# Patient Record
Sex: Male | Born: 1985 | Race: White | Hispanic: No | Marital: Single | Smoking: Never smoker
Health system: Southern US, Community
[De-identification: ages and names within clinical notes are randomized; demographics above are authoritative.]

---

## 2013-01-10 ENCOUNTER — Emergency Department (HOSPITAL_COMMUNITY)
Admission: EM | Admit: 2013-01-10 | Discharge: 2013-01-11 | Disposition: A | Discharge status: Home / Self Care | Payer: BC Managed Care – PPO | Attending: Emergency Medicine | Admitting: Emergency Medicine

## 2013-01-10 ENCOUNTER — Encounter (HOSPITAL_COMMUNITY): Payer: Self-pay | Admitting: *Deleted

## 2013-01-10 DIAGNOSIS — T07XXXA Unspecified multiple injuries, initial encounter: Secondary | ICD-10-CM

## 2013-01-10 DIAGNOSIS — Y92410 Unspecified street and highway as the place of occurrence of the external cause: Secondary | ICD-10-CM | POA: Insufficient documentation

## 2013-01-10 DIAGNOSIS — S0990XA Unspecified injury of head, initial encounter: Secondary | ICD-10-CM | POA: Insufficient documentation

## 2013-01-10 DIAGNOSIS — Y9389 Activity, other specified: Secondary | ICD-10-CM | POA: Insufficient documentation

## 2013-01-10 DIAGNOSIS — IMO0002 Reserved for concepts with insufficient information to code with codable children: Secondary | ICD-10-CM | POA: Insufficient documentation

## 2013-01-10 DIAGNOSIS — F1010 Alcohol abuse, uncomplicated: Secondary | ICD-10-CM | POA: Insufficient documentation
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
## 2013-01-10 NOTE — ED Notes (Signed)
Patient removed collar by himself.  Directed him not to to off the collar but he would not listen

## 2013-01-10 NOTE — ED Notes (Signed)
[employer] here to see patient.
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
## 2013-01-11 ENCOUNTER — Emergency Department (HOSPITAL_COMMUNITY): Payer: BC Managed Care – PPO

## 2013-01-11 ENCOUNTER — Encounter (HOSPITAL_COMMUNITY): Payer: Self-pay | Admitting: Radiology

## 2013-01-11 LAB — CBC WITH DIFFERENTIAL/PLATELET
Lymphocytes Relative: 23 % (ref 12–46)
Lymphs Abs: 2.7 10*3/uL (ref 0.7–4.0)
MCV: 86.2 fL (ref 78.0–100.0)
Neutrophils Relative %: 72 % (ref 43–77)
Platelets: 120 10*3/uL — ABNORMAL LOW (ref 150–400)
RBC: 5 MIL/uL (ref 4.22–5.81)
WBC: 11.9 10*3/uL — ABNORMAL HIGH (ref 4.0–10.5)

## 2013-01-11 LAB — COMPREHENSIVE METABOLIC PANEL
AST: 24 U/L (ref 0–37)
CO2: 27 mEq/L (ref 19–32)
Calcium: 9.1 mg/dL (ref 8.4–10.5)
Creatinine, Ser: 0.94 mg/dL (ref 0.50–1.35)
GFR calc Af Amer: >90 mL/min (ref >90)
GFR calc non Af Amer: >90 mL/min (ref >90)

## 2013-01-11 LAB — ETHANOL: Alcohol, Ethyl (B): 231 mg/dL — ABNORMAL HIGH (ref 0–11)

## 2013-01-11 MED ORDER — HYDROCODONE-ACETAMINOPHEN 5-325 MG PO TABS
1.0000 | ORAL_TABLET | Freq: Once | ORAL | Status: AC
  Administered 2013-01-11: 1 via ORAL
  Filled 2013-01-11: qty 1

## 2013-01-11 MED ORDER — HYDROCODONE-ACETAMINOPHEN 5-325 MG PO TABS: 1.0000 | ORAL_TABLET | Freq: Four times a day (QID) | ORAL | Status: DC | PRN
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
## 2021-11-13 ENCOUNTER — Encounter (HOSPITAL_COMMUNITY): Payer: Self-pay

## 2021-11-13 ENCOUNTER — Emergency Department (HOSPITAL_COMMUNITY)
Admission: EM | Admit: 2021-11-13 | Discharge: 2021-11-13 | Disposition: A | Discharge status: Home / Self Care | Payer: 59 | Attending: Emergency Medicine | Admitting: Emergency Medicine

## 2021-11-13 ENCOUNTER — Emergency Department (HOSPITAL_COMMUNITY): Payer: 59

## 2021-11-13 ENCOUNTER — Other Ambulatory Visit: Payer: Self-pay

## 2021-11-13 DIAGNOSIS — S62512A Displaced fracture of proximal phalanx of left thumb, initial encounter for closed fracture: Secondary | ICD-10-CM | POA: Diagnosis not present

## 2021-11-13 DIAGNOSIS — Y92009 Unspecified place in unspecified non-institutional (private) residence as the place of occurrence of the external cause: Secondary | ICD-10-CM | POA: Diagnosis not present

## 2021-11-13 DIAGNOSIS — S65402A Unspecified injury of blood vessel of left thumb, initial encounter: Secondary | ICD-10-CM | POA: Diagnosis not present

## 2021-11-13 DIAGNOSIS — S62512B Displaced fracture of proximal phalanx of left thumb, initial encounter for open fracture: Secondary | ICD-10-CM | POA: Insufficient documentation

## 2021-11-13 DIAGNOSIS — Z23 Encounter for immunization: Secondary | ICD-10-CM | POA: Diagnosis not present

## 2021-11-13 DIAGNOSIS — W232XXA Caught, crushed, jammed or pinched between a moving and stationary object, initial encounter: Secondary | ICD-10-CM | POA: Diagnosis not present

## 2021-11-13 DIAGNOSIS — S61012A Laceration without foreign body of left thumb without damage to nail, initial encounter: Secondary | ICD-10-CM | POA: Diagnosis not present

## 2021-11-13 MED ORDER — CEPHALEXIN 500 MG PO CAPS: 500.0000 mg | ORAL_CAPSULE | Freq: Four times a day (QID) | ORAL | 0 refills | Status: AC

## 2021-11-13 MED ORDER — IBUPROFEN 600 MG PO TABS: 600.0000 mg | ORAL_TABLET | Freq: Three times a day (TID) | ORAL | 0 refills | Status: AC

## 2021-11-13 MED ORDER — POVIDONE-IODINE 10 % EX SOLN
CUTANEOUS | Status: AC
  Filled 2021-11-13: qty 29.6

## 2021-11-13 MED ORDER — CEFAZOLIN SODIUM-DEXTROSE 1-4 GM/50ML-% IV SOLN
1.0000 g | Freq: Once | INTRAVENOUS | Status: AC
  Administered 2021-11-13: 1 g via INTRAVENOUS
  Filled 2021-11-13: qty 50

## 2021-11-13 MED ORDER — CEPHALEXIN 500 MG PO CAPS: 500.0000 mg | ORAL_CAPSULE | Freq: Four times a day (QID) | ORAL | 0 refills | Status: DC

## 2021-11-13 MED ORDER — HYDROCODONE-ACETAMINOPHEN 5-325 MG PO TABS: 1.0000 | ORAL_TABLET | ORAL | 0 refills | Status: DC | PRN

## 2021-11-13 MED ORDER — IBUPROFEN 600 MG PO TABS: 600.0000 mg | ORAL_TABLET | Freq: Three times a day (TID) | ORAL | 0 refills | Status: DC

## 2021-11-13 MED ORDER — TETANUS-DIPHTH-ACELL PERTUSSIS 5-2.5-18.5 LF-MCG/0.5 IM SUSY
0.5000 mL | PREFILLED_SYRINGE | Freq: Once | INTRAMUSCULAR | Status: AC
  Administered 2021-11-13: 0.5 mL via INTRAMUSCULAR
  Filled 2021-11-13: qty 0.5

## 2021-11-13 MED ORDER — BUPIVACAINE HCL (PF) 0.5 % IJ SOLN
10.0000 mL | Freq: Once | INTRAMUSCULAR | Status: AC
  Administered 2021-11-13: 10 mL
  Filled 2021-11-13: qty 30

## 2021-11-13 NOTE — Discharge Instructions (Signed)
Take the entire course of the antibiotics prescribed.  You may also use ice and elevation to help with pain and swelling along with the ibuprofen prescribed.  Use the hydrocodone if needed for additional pain relief.  This medication will make you drowsy, do not drive within 4 hours of taking this.  I do recommend changing the dressing on Sunday, look for any signs of infection including redness, increased swelling, drainage of pus.  If it looks okay, apply a new dressing.  Get rechecked if there is any concern for infection.

## 2021-11-13 NOTE — ED Triage Notes (Signed)
Patient arrived with complaints of laceration to left thumb. Patient stated that he cut finger on a ground rod. Laceration is open with bone exposed.
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
## 2021-11-17 ENCOUNTER — Encounter: Payer: Self-pay | Admitting: Orthopedic Surgery

## 2021-11-17 ENCOUNTER — Ambulatory Visit (INDEPENDENT_AMBULATORY_CARE_PROVIDER_SITE_OTHER): Payer: 59

## 2021-11-17 ENCOUNTER — Ambulatory Visit: Payer: 59 | Admitting: Orthopedic Surgery

## 2021-11-17 VITALS — BP 136/83 | HR 66 | Ht 67.0 in | Wt 155.0 lb

## 2021-11-17 DIAGNOSIS — S6992XA Unspecified injury of left wrist, hand and finger(s), initial encounter: Secondary | ICD-10-CM | POA: Diagnosis not present

## 2021-11-17 DIAGNOSIS — S62512B Displaced fracture of proximal phalanx of left thumb, initial encounter for open fracture: Secondary | ICD-10-CM

## 2021-11-17 NOTE — Patient Instructions (Signed)
General Cast Instructions  1.  You were placed in a splint in clinic today.  Please keep the cast material clean, dry and intact.  Please do not use anything to itch the under the splint.  If it gets itchy, you can consider taking benadryl, or similar medication.  If the cast material gets wet, place it on a towel and use a hair dryer on a low setting. 2.  Tylenol or Ibuprofen/Naproxen as needed.   3.  Recommend elevating your extremity as much as possible to help with swelling. 4.  F/u 2 weeks, splint off and repeat XR   Medications  We have discussed taking over the counter medications for your pain.  Below are some common medicines to consider.  Also listed are the recommended doses and how to take these medications as a prescription strength medicine.  If you experience any issues or side effects, please discontinue the medicine and contact the office for some assistance.   Tylenol or acetaminophen - can be used to help treat minor pains and fevers or chills.  Common doses include 350 mg, 500 mg and 650 mg.  Following surgery, I recommend taking 1000 mg of this medication three times a day.  Some narcotic pain medications contain acetaminophen, so you cannot take additional acetaminophen. This medicine can be bad for your liver.  DO NOT TAKE MORE THAN 4000 mg per day.  Advil, Motrin or Ibuprofen - anti-inflammatory medicines.  Can be used to treat chronic pain and help with swelling.  Common over the counter dose includes 200 mg.  Bottle states you can take 1-2 pills.  Common prescription doses include 600 mg or 800 mg pills.  This medication can be hard on your stomach and your kidneys.  If you have a history of stomach ulcers or kidney issues, you should discuss this with your PCP.  This medicine can be taken 3 times per day.  You should take this medication with food in your stomach.   Aleve or Naproxen - anti inflammatory medicines.  Can be used to treat chronic pain and help with swelling.  Common over the counter dose is 220 mg.  The bottle states you can take 1 pill, twice a day.   Typical prescription dose is 500 mg two times a day.  This medication can be hard on your stomach and your kidneys.  If you have a history of stomach ulcers or kidney issues, you should discuss this with your PCP.  This medicine can be taken 2 times per day.  You should take this medication with food in your stomach.    Acetaminopen and ibuprofen/naproxen can be taking at the same time, but please do not take ibuprofen and naproxen at the same time.  These medications work the same way and can cause further injury to your stomach and/or kidneys.    Patients taking a blood thinner are often encouraged not to take NSAIDs or medications like ibuprofen or naproxen because they increase the possibility of internal bleeding.  If you have any questions, you should contact the doctor who recommended these medicines.

## 2021-11-17 NOTE — Progress Notes (Signed)
New Patient Visit  Assessment: Cory Maldonado is a 36 y.o. male with the following: 1. Open displaced fracture of proximal phalanx of left thumb, initial encounter  Plan: Darrnell Mangiaracina Tyndall sustained a left thumb fracture, with an associated laceration, a few days ago.  His pain is controlled.  Repeat radiographs today demonstrates comminution at the IP joint of the left thumb, but overall, the IP joint remains in good alignment.  We will plan to treat this without surgery.  I have advised him to continue taking the antibiotics until completion.  He was placed in a thumb spica splint in clinic today.  I will see him back in approximately 2 weeks for repeat evaluation.  Continue to take Tylenol and ibuprofen as needed.  Cast application -left short arm splint-thumb spica   Verbal consent was obtained and the correct extremity was identified. A well padded, appropriately molded thumb spica splint was applied to the left arm Fingers remained warm and well perfused.   There were no sharp edges Patient tolerated the procedure well Cast care instructions were provided     Follow-up: Return in about 2 weeks (around 12/01/2021).  Subjective:  Chief Complaint  Patient presents with   Hand Injury    Thumb left/ crush injury 11/13/20 has been changing dressings daily     History of Present Illness: Cory Maldonado is a 36 y.o. male who presents for evaluation of left thumb pain.  A few days ago, he sustained an injury to his left thumb.  He was using an impactor, to put in fence post.  He missed the fence post, and subsequently crushed his left thumb.  He was evaluated in the emergency department.  He had an associated laceration with a fracture of the proximal phalanx of the left thumb.  He has had an AlumaFoam splint on his thumb.  He is taking ibuprofen for pain only.  No fevers or chills.  He has been taking antibiotics as prescribed by the ED.   Review of Systems: No fevers or chills No  numbness or tingling No chest pain No shortness of breath No bowel or bladder dysfunction No GI distress No headaches   Medical History:  History reviewed. No pertinent past medical history.  History reviewed. No pertinent surgical history.  History reviewed. No pertinent family history. Social History   Tobacco Use   Smoking status: Never   Smokeless tobacco: Current    Types: Snuff  Vaping Use   Vaping Use: Never used  Substance Use Topics   Alcohol use: Not Currently   Drug use: Not Currently    No Known Allergies  Current Meds  Medication Sig   cephALEXin (KEFLEX) 500 MG capsule Take 1 capsule (500 mg total) by mouth 4 (four) times daily.   ibuprofen (ADVIL) 600 MG tablet Take 1 tablet (600 mg total) by mouth 3 (three) times daily.   [DISCONTINUED] HYDROcodone-acetaminophen (NORCO/VICODIN) 5-325 MG tablet Take 1 tablet by mouth every 4 (four) hours as needed.    Objective: BP 136/83   Pulse 66   Ht 5\' 7"  (1.702 m)   Wt 155 lb (70.3 kg)   BMI 24.28 kg/m   Physical Exam:  General: Alert and oriented. and No acute distress. Gait: Normal gait.  Left thumb with a laceration on the dorsal aspect.  This is healing well.  No purulence.  No surrounding erythema.  Dissolvable sutures remain in place.  Limited swelling of the thumb.  He has sensation intact on  the radial and ulnar aspect of the thumb.  Good capillary refill to the tip of his thumb.  IMAGING: I personally ordered and reviewed the following images  X-rays of the left thumb were obtained in clinic today.  There is a comminuted fracture of the distal extent of the proximal phalanx.  This involves the joint line.  Overall, there is minimal displacement of the fragments.  No change in overall alignment compared to the injury films.  Impression: Comminuted fracture of the distal aspect of the left thumb proximal phalanx, acceptable alignment overall.  New Medications:  No orders of the defined types were  placed in this encounter.     Oliver Barre, MD  11/17/2021 10:15 PM

## 2021-12-02 ENCOUNTER — Ambulatory Visit (INDEPENDENT_AMBULATORY_CARE_PROVIDER_SITE_OTHER): Payer: 59 | Admitting: Orthopedic Surgery

## 2021-12-02 ENCOUNTER — Encounter: Payer: Self-pay | Admitting: Orthopedic Surgery

## 2021-12-02 ENCOUNTER — Ambulatory Visit (INDEPENDENT_AMBULATORY_CARE_PROVIDER_SITE_OTHER): Payer: 59

## 2021-12-02 VITALS — Ht 67.0 in | Wt 155.0 lb

## 2021-12-02 DIAGNOSIS — S62512D Displaced fracture of proximal phalanx of left thumb, subsequent encounter for fracture with routine healing: Secondary | ICD-10-CM | POA: Diagnosis not present

## 2021-12-02 NOTE — Patient Instructions (Signed)
General Cast Instructions  1.  You were placed in a cast in clinic today.  Please keep the cast material clean, dry and intact.  Please do not use anything to itch the under the cast.  If it gets itchy, you can consider taking benadryl, or similar medication.  If the cast material gets wet, place it on a towel and use a hair dryer on a low setting. 2.  Tylenol or Ibuprofen/Naproxen as needed.   3.  Recommend elevating your extremity as much as possible to help with swelling. 4.  F/u 3 weeks, cast off and repeat XR  

## 2021-12-02 NOTE — Progress Notes (Signed)
Return Patient Visit  Assessment: Cory Maldonado is a 36 y.o. male with the following: 1. Open displaced fracture of proximal phalanx of left thumb  Plan: Alvie Fowles Forker sustained a left thumb IP joint, intra-articular fracture, with comminution.  He has no pain.  Radiographs are stable.  The associated laceration is healing well.  No concerns at this time.  We will keep him immobilized for another 3 weeks.  Cast application -left short arm splint-thumb spica   Verbal consent was obtained and the correct extremity was identified. A well padded, appropriately molded thumb spica splint was applied to the left arm Fingers remained warm and well perfused.   There were no sharp edges Patient tolerated the procedure well Cast care instructions were provided     Follow-up: Return in about 3 weeks (around 12/23/2021).  Subjective:  Chief Complaint  Patient presents with   Fracture    LT thumb DOI 11/13/21    History of Present Illness: Cory Maldonado is a 36 y.o. male who returns for evaluation of left thumb pain.  Approximately 2-3 weeks ago, he sustained an injury to his left thumb.  He was placed in a splint.  He also had a laceration.  He completed antibiotics.  No issues with healing.  No fevers or chills.  He states he has been removing the fiberglass splint to rest the thumb.  He has not been using his left hand.  He has done some work, but is only using his right hand.  Review of Systems: No fevers or chills No numbness or tingling No chest pain No shortness of breath No bowel or bladder dysfunction No GI distress No headaches   Objective: Ht 5\' 7"  (1.702 m)   Wt 155 lb (70.3 kg)   BMI 24.28 kg/m   Physical Exam:  General: Alert and oriented. and No acute distress. Gait: Normal gait.  Left thumb dorsal laceration is healing well.  There is some scabbing.  No numbness or tingling on the radial or ulnar aspect of the thumb.  Some numbness on the dorsal aspect,  overlying the laceration.  Limited motion at the IP joint.  He does have some swelling.  No bruising.  IMAGING: I personally ordered and reviewed the following images  Multiple views of the left thumb were obtained in clinic today.  These are compared to prior x-rays.  He has a comminuted fracture of the distal aspect of the proximal phalanx, with IP joint extension.  Multiple fragments which are essentially anatomically aligned.  These are unchanged.  No additional injuries are noted.  Impression: Left thumb proximal phalanx, distal intra-articular comminuted fracture in stable alignment.  New Medications:  No orders of the defined types were placed in this encounter.     , MD  12/02/2021 8:42 AM

## 2021-12-04 ENCOUNTER — Encounter: Payer: 59 | Admitting: Orthopedic Surgery

## 2021-12-22 ENCOUNTER — Ambulatory Visit (INDEPENDENT_AMBULATORY_CARE_PROVIDER_SITE_OTHER): Payer: 59 | Admitting: Orthopedic Surgery

## 2021-12-22 ENCOUNTER — Ambulatory Visit (INDEPENDENT_AMBULATORY_CARE_PROVIDER_SITE_OTHER): Payer: 59

## 2021-12-22 ENCOUNTER — Encounter: Payer: Self-pay | Admitting: Orthopedic Surgery

## 2021-12-22 DIAGNOSIS — S62512D Displaced fracture of proximal phalanx of left thumb, subsequent encounter for fracture with routine healing: Secondary | ICD-10-CM | POA: Diagnosis not present

## 2021-12-22 NOTE — Patient Instructions (Signed)
Hand Exercises  Hand exercises can be helpful for almost anyone. These exercises can strengthen the hands, improve flexibility and movement, and increase blood flow to the hands. These results can make work and daily tasks easier. Hand exercises can be especially helpful for people who have joint pain from arthritis or have nerve damage from overuse (carpal tunnel syndrome). These exercises can also help people who have injured a hand.  Exercises Most of these hand exercises are gentle stretching and motion exercises. It is usually safe to do them often throughout the day. Warming up your hands before exercise may help to reduce stiffness. You can do this with gentle massage or by placing your hands in warm water for 10-15 minutes. It is normal to feel some stretching, pulling, tightness, or mild discomfort as you begin new exercises. This will gradually improve. Stop an exercise right away if you feel sudden, severe pain or your pain gets worse. Ask your health care provider which exercises are best for you. Knuckle bend or "claw" fist Stand or sit with your arm, hand, and all five fingers pointed straight up. Make sure to keep your wrist straight during the exercise. Gently bend your fingers down toward your palm until the tips of your fingers are touching the top of your palm. Keep your big knuckle straight and just bend the small knuckles in your fingers. Hold this position for 10 seconds. Straighten (extend) your fingers back to the starting position. Repeat this exercise 5-10 times with each hand. Full finger fist Stand or sit with your arm, hand, and all five fingers pointed straight up. Make sure to keep your wrist straight during the exercise. Gently bend your fingers into your palm until the tips of your fingers are touching the middle of your palm. Hold this position for 10 seconds. Extend your fingers back to the starting position, stretching every joint fully. Repeat this exercise  5-10 times with each hand. Straight fist Stand or sit with your arm, hand, and all five fingers pointed straight up. Make sure to keep your wrist straight during the exercise. Gently bend your fingers at the big knuckle, where your fingers meet your hand, and the middle knuckle. Keep the knuckle at the tips of your fingers straight and try to touch the bottom of your palm. Hold this position for 10 seconds. Extend your fingers back to the starting position, stretching every joint fully. Repeat this exercise 5-10 times with each hand. Tabletop Stand or sit with your arm, hand, and all five fingers pointed straight up. Make sure to keep your wrist straight during the exercise. Gently bend your fingers at the big knuckle, where your fingers meet your hand, as far down as you can while keeping the small knuckles in your fingers straight. Think of forming a tabletop with your fingers. Hold this position for 10 seconds. Extend your fingers back to the starting position, stretching every joint fully. Repeat this exercise 5-10 times with each hand. Finger spread Place your hand flat on a table with your palm facing down. Make sure your wrist stays straight as you do this exercise. Spread your fingers and thumb apart from each other as far as you can until you feel a gentle stretch. Hold this position for 10 seconds. Bring your fingers and thumb tight together again. Hold this position for 10 seconds. Repeat this exercise 5-10 times with each hand. Making circles Stand or sit with your arm, hand, and all five fingers pointed straight up. Make   sure to keep your wrist straight during the exercise. Make a circle by touching the tip of your thumb to the tip of your index finger. Hold for 10 seconds. Then open your hand wide. Repeat this motion with your thumb and each finger on your hand. Repeat this exercise 5-10 times with each hand. Thumb motion Sit with your forearm resting on a table and your wrist  straight. Your thumb should be facing up toward the ceiling. Keep your fingers relaxed as you move your thumb. Lift your thumb up as high as you can toward the ceiling. Hold for 10 seconds. Bend your thumb across your palm as far as you can, reaching the tip of your thumb for the small finger (pinkie) side of your palm. Hold for 10 seconds. Repeat this exercise 5-10 times with each hand. Grip strengthening Hold a stress ball or other soft ball in the middle of your hand. Slowly increase the pressure, squeezing the ball as much as you can without causing pain. Think of bringing the tips of your fingers into the middle of your palm. All of your finger joints should bend when doing this exercise. Hold your squeeze for 10 seconds, then relax. Repeat this exercise 5-10 times with each hand.   Contact a health care provider if: Your hand pain or discomfort gets much worse when you do an exercise. Your hand pain or discomfort does not improve within 2 hours after you exercise. If you have any of these problems, stop doing these exercises right away. Do not do them again unless your health care provider says that you can.    Get help right away if: You develop sudden, severe hand pain or swelling. If this happens, stop doing these exercises right away. Do not do them again unless your health care provider says that you can. This information is not intended to replace advice given to you by your health care provider. Make sure you discuss any questions you have with your health care provider.  

## 2021-12-23 NOTE — Progress Notes (Signed)
Return Patient Visit  Assessment: Cory Maldonado is a 36 y.o. male with the following: 1. Open displaced fracture of proximal phalanx of left thumb  Plan: Cory Maldonado sustained a left thumb IP joint, intra-articular fracture, with comminution.  Radiographs are stable.  Minimal pain.  On physical exam, he does have some stiffness at the IP joint, which is to be expected.  Okay to progress his activities.  Advised him to advance slowly.  He states his understanding.  Follow-up in 1 month.   Follow-up: Return in about 4 weeks (around 01/19/2022).  Subjective:  Chief Complaint  Patient presents with   Hand Injury    Left thumb 11/13/21    History of Present Illness: Cory Maldonado is a 36 y.o. male who returns for evaluation of left thumb pain.  Approximately 5-6 weeks ago, he sustained an injury to his left thumb.  The laceration has healed.  He has been immobilizing his thumb, while working.  He has had minimal pain.  No fevers or chills.  No numbness or tingling.  Review of Systems: No fevers or chills No numbness or tingling No chest pain No shortness of breath No bowel or bladder dysfunction No GI distress No headaches   Objective: There were no vitals taken for this visit.  Physical Exam:  General: Alert and oriented. and No acute distress. Gait: Normal gait.  Well-healed dorsal left thumb laceration.  No surrounding erythema or drainage.  No tenderness to palpation.  Restricted range of motion at the IP joint.  No tenderness to palpation at the IP joint.  Sensation is intact to the distal aspect of the left thumb.  IMAGING: I personally ordered and reviewed the following images  X-rays of the left thumb were obtained in clinic today.  These were compared to prior x-rays.  There has been no interval displacement of the intra-articular, proximal phalanx fracture.  The fracture is intra-articular, but the fragments have remained in good position.  No additional  injuries are noted.  Impression: Healing left proximal phalanx fracture, with comminution and intra-articular extension.  New Medications:  No orders of the defined types were placed in this encounter.     Oliver Barre, MD  12/23/2021 10:04 AM

## 2022-01-19 ENCOUNTER — Encounter: Payer: 59 | Admitting: Orthopedic Surgery
